# Patient Record
Sex: Female | Born: 1945 | Race: White | Hispanic: No | Marital: Married | Smoking: Never smoker
Health system: Southern US, Community
[De-identification: ages and names within clinical notes are randomized; demographics above are authoritative.]

## PROBLEM LIST (undated history)

## (undated) DIAGNOSIS — I10 Essential (primary) hypertension: Secondary | ICD-10-CM

## (undated) DIAGNOSIS — E7800 Pure hypercholesterolemia, unspecified: Secondary | ICD-10-CM

## (undated) HISTORY — PX: MELANOMA EXCISION: SHX5266

---

## 2001-09-05 ENCOUNTER — Other Ambulatory Visit: Admission: RE | Admit: 2001-09-05 | Discharge: 2001-09-05 | Payer: Self-pay | Admitting: Obstetrics and Gynecology

## 2002-10-14 ENCOUNTER — Ambulatory Visit (HOSPITAL_COMMUNITY): Admission: RE | Admit: 2002-10-14 | Discharge: 2002-10-14 | Payer: Self-pay | Admitting: Family Medicine

## 2002-10-14 ENCOUNTER — Encounter: Payer: Self-pay | Admitting: Family Medicine

## 2003-12-15 ENCOUNTER — Emergency Department (HOSPITAL_COMMUNITY): Admission: EM | Admit: 2003-12-15 | Discharge: 2003-12-15 | Payer: Self-pay | Admitting: Emergency Medicine

## 2004-03-23 ENCOUNTER — Ambulatory Visit (HOSPITAL_COMMUNITY): Admission: RE | Admit: 2004-03-23 | Discharge: 2004-03-23 | Payer: Self-pay | Admitting: Family Medicine

## 2007-03-20 ENCOUNTER — Ambulatory Visit (HOSPITAL_COMMUNITY): Admission: RE | Admit: 2007-03-20 | Discharge: 2007-03-20 | Payer: Self-pay | Admitting: Family Medicine

## 2007-06-24 ENCOUNTER — Ambulatory Visit (HOSPITAL_COMMUNITY): Admission: RE | Admit: 2007-06-24 | Discharge: 2007-06-24 | Payer: Self-pay | Admitting: Internal Medicine

## 2010-01-17 ENCOUNTER — Ambulatory Visit (HOSPITAL_COMMUNITY): Admission: RE | Admit: 2010-01-17 | Discharge: 2010-01-17 | Payer: Self-pay | Admitting: Obstetrics and Gynecology

## 2010-12-09 ENCOUNTER — Encounter: Payer: Self-pay | Admitting: Family Medicine

## 2010-12-10 ENCOUNTER — Encounter: Payer: Self-pay | Admitting: Family Medicine

## 2012-09-03 ENCOUNTER — Other Ambulatory Visit (HOSPITAL_COMMUNITY): Payer: Self-pay | Admitting: Obstetrics and Gynecology

## 2012-09-03 DIAGNOSIS — Z139 Encounter for screening, unspecified: Secondary | ICD-10-CM

## 2012-09-26 ENCOUNTER — Ambulatory Visit (HOSPITAL_COMMUNITY)
Admission: RE | Admit: 2012-09-26 | Discharge: 2012-09-26 | Disposition: A | Discharge status: Home / Self Care | Payer: BC Managed Care – PPO | Source: Ambulatory Visit | Attending: Obstetrics and Gynecology | Admitting: Obstetrics and Gynecology

## 2012-09-26 DIAGNOSIS — Z1231 Encounter for screening mammogram for malignant neoplasm of breast: Secondary | ICD-10-CM | POA: Insufficient documentation

## 2012-09-26 DIAGNOSIS — Z139 Encounter for screening, unspecified: Secondary | ICD-10-CM

## 2015-01-21 ENCOUNTER — Emergency Department (HOSPITAL_COMMUNITY): Payer: PPO

## 2015-01-21 ENCOUNTER — Observation Stay (HOSPITAL_COMMUNITY)
Admission: EM | Admit: 2015-01-21 | Discharge: 2015-01-22 | Disposition: A | Discharge status: Home / Self Care | Payer: PPO | Attending: Internal Medicine | Admitting: Internal Medicine

## 2015-01-21 ENCOUNTER — Encounter (HOSPITAL_COMMUNITY): Payer: Self-pay | Admitting: Emergency Medicine

## 2015-01-21 DIAGNOSIS — Z79899 Other long term (current) drug therapy: Secondary | ICD-10-CM | POA: Diagnosis not present

## 2015-01-21 DIAGNOSIS — E7800 Pure hypercholesterolemia, unspecified: Secondary | ICD-10-CM | POA: Diagnosis present

## 2015-01-21 DIAGNOSIS — I10 Essential (primary) hypertension: Secondary | ICD-10-CM

## 2015-01-21 DIAGNOSIS — R079 Chest pain, unspecified: Principal | ICD-10-CM

## 2015-01-21 HISTORY — DX: Essential (primary) hypertension: I10

## 2015-01-21 HISTORY — DX: Pure hypercholesterolemia, unspecified: E78.00

## 2015-01-21 LAB — BASIC METABOLIC PANEL
Anion gap: 8 (ref 5–15)
BUN: 11 mg/dL (ref 6–23)
CHLORIDE: 102 mmol/L (ref 96–112)
CO2: 29 mmol/L (ref 19–32)
CREATININE: 0.82 mg/dL (ref 0.50–1.10)
Calcium: 9.6 mg/dL (ref 8.4–10.5)
GFR calc non Af Amer: 73 mL/min — ABNORMAL LOW (ref >90)
GFR, EST AFRICAN AMERICAN: 85 mL/min — AB (ref >90)
GLUCOSE: 117 mg/dL — AB (ref 70–99)
POTASSIUM: 3.2 mmol/L — AB (ref 3.5–5.1)
Sodium: 139 mmol/L (ref 135–145)

## 2015-01-21 LAB — CBC
HEMATOCRIT: 39.2 % (ref 36.0–46.0)
HEMOGLOBIN: 13 g/dL (ref 12.0–15.0)
MCH: 30.6 pg (ref 26.0–34.0)
MCHC: 33.2 g/dL (ref 30.0–36.0)
MCV: 92.2 fL (ref 78.0–100.0)
Platelets: 257 10*3/uL (ref 150–400)
RBC: 4.25 MIL/uL (ref 3.87–5.11)
RDW: 13.2 % (ref 11.5–15.5)
WBC: 7.5 10*3/uL (ref 4.0–10.5)

## 2015-01-21 LAB — TROPONIN I

## 2015-01-21 MED ORDER — ASPIRIN 325 MG PO TABS
325.0000 mg | ORAL_TABLET | ORAL | Status: AC
  Administered 2015-01-21: 325 mg via ORAL
  Filled 2015-01-21: qty 1

## 2015-01-21 MED ORDER — ONDANSETRON HCL 4 MG/2ML IJ SOLN: 4.0000 mg | Freq: Four times a day (QID) | INTRAMUSCULAR | Status: DC | PRN

## 2015-01-21 MED ORDER — ACETAMINOPHEN 325 MG PO TABS: 650.0000 mg | ORAL_TABLET | ORAL | Status: DC | PRN

## 2015-01-21 MED ORDER — ASPIRIN EC 325 MG PO TBEC: 325.0000 mg | DELAYED_RELEASE_TABLET | Freq: Every day | ORAL | Status: DC

## 2015-01-21 MED ORDER — POTASSIUM CHLORIDE CRYS ER 20 MEQ PO TBCR
40.0000 meq | EXTENDED_RELEASE_TABLET | Freq: Once | ORAL | Status: AC
  Administered 2015-01-21: 40 meq via ORAL
  Filled 2015-01-21: qty 2

## 2015-01-21 MED ORDER — GI COCKTAIL ~~LOC~~: 30.0000 mL | Freq: Four times a day (QID) | ORAL | Status: DC | PRN

## 2015-01-21 NOTE — ED Notes (Addendum)
Pt arrives to registration area c/o of "chest burning" and "face pain". Denies all associated symptoms. NAD. Patient walking/speaking. Patient drove here from [place] to be seen.

## 2015-01-21 NOTE — ED Notes (Signed)
[facility], all questions answered.

## 2015-01-21 NOTE — ED Notes (Signed)
Call report attempt 2003 and again at 2023

## 2015-01-21 NOTE — ED Provider Notes (Signed)
CSN: [id number]     Arrival date & time 01/21/15  1730 History   First MD Initiated Contact with Patient 01/21/15 1754     Chief Complaint  Patient presents with  . Chest Pain      HPI Pt was seen at 1810. Per pt, c/o gradual onset and resolution of one episode of chest "pain" that occurred approximately 1630 PTA. Pt states she was driving her car when she felt a "heavy" chest discomfort which radiated a "tingling sensation" into her left neck and jaw. Pt states "it was not my indigestion." Pt states her symptoms lasted until arrival to the ED. Denies symptoms currently. Denies hx of same. Denies SOB/cough, no abd pain, no back pain, no N/V/D, no focal motor weakness, no tingling/numbness in extremities, no slurred speech, no facial droop.    Past Medical History  Diagnosis Date  . Hypertension   . High cholesterol    History reviewed. No pertinent past surgical history. Family History  Problem Relation Age of Onset  . Heart attack Mother     in her 25's  . Heart attack Father     in his 38's  . Heart attack Brother     multiple brother's with MI's in their 40's-50's   History  Substance Use Topics  . Smoking status: Never Smoker   . Smokeless tobacco: Not on file  . Alcohol Use: No    Review of Systems ROS: Statement: All systems negative except as marked or noted in the HPI; Constitutional: Negative for fever and chills. ; ; Eyes: Negative for eye pain, redness and discharge. ; ; ENMT: Negative for ear pain, hoarseness, nasal congestion, sinus pressure and sore throat. ; ; Cardiovascular: +CP. Negative for palpitations, diaphoresis, dyspnea and peripheral edema. ; ; Respiratory: Negative for cough, wheezing and stridor. ; ; Gastrointestinal: Negative for nausea, vomiting, diarrhea, abdominal pain, blood in stool, hematemesis, jaundice and rectal bleeding. . ; ; Genitourinary: Negative for dysuria, flank pain and hematuria. ; ; Musculoskeletal: Negative for back pain and neck  pain. Negative for swelling and trauma.; ; Skin: Negative for pruritus, rash, abrasions, blisters, bruising and skin lesion.; ; Neuro: +paresthesias. Negative for headache, lightheadedness and neck stiffness. Negative for weakness, altered level of consciousness , altered mental status, extremity weakness, involuntary movement, seizure and syncope.      Allergies  Review of patient's allergies indicates no known allergies.  Home Medications   Prior to Admission medications   Medication Sig Start Date End Date Taking? Authorizing Provider  hydrochlorothiazide (MICROZIDE) 12.5 MG capsule  12/27/14   Historical Provider, MD  simvastatin (ZOCOR) 10 MG tablet  12/27/14   Historical Provider, MD   BP 144/78 mmHg  Pulse 78  Resp 16  SpO2 100% Physical Exam 1815: Physical examination:  Nursing notes reviewed; Vital signs and O2 SAT reviewed;  Constitutional: Well developed, Well nourished, Well hydrated, In no acute distress; Head:  Normocephalic, atraumatic; Eyes: EOMI, PERRL, No scleral icterus; ENMT: Mouth and pharynx normal, Mucous membranes moist; Neck: Supple, Full range of motion, No lymphadenopathy; Cardiovascular: Regular rate and rhythm, No murmur, rub, or gallop; Respiratory: Breath sounds clear & equal bilaterally, No rales, rhonchi, wheezes.  Speaking full sentences with ease, Normal respiratory effort/excursion; Chest: Nontender, Movement normal; Abdomen: Soft, Nontender, Nondistended, Normal bowel sounds; Genitourinary: No CVA tenderness; Extremities: Pulses normal, No tenderness, No edema, No calf edema or asymmetry.; Neuro: AA&Ox3, Major CN grossly intact. No facial droop. Speech clear. No gross focal motor or sensory deficits  in extremities.; Skin: Color normal, Warm, Dry.   ED Course  Procedures     EKG Interpretation   Date/Time:  Friday January 21 2015 17:52:08 EST Ventricular Rate:  92 PR Interval:  161 QRS Duration: 89 QT Interval:  366 QTC Calculation: 453 R Axis:    78 Text Interpretation:  Sinus rhythm No old tracing to compare Confirmed by  [facility]  MD, Nicholos JohnsKATHLEEN [phone]([id number]) on 01/21/2015 5:59:25 PM      MDM  MDM Reviewed: previous chart, nursing note and vitals Reviewed previous: labs Interpretation: labs, ECG and x-ray      Results for orders placed or performed during the hospital encounter of 01/21/15  CBC  Result Value Ref Range   WBC 7.5 4.0 - 10.5 K/uL   RBC 4.25 3.87 - 5.11 MIL/uL   Hemoglobin 13.0 12.0 - 15.0 g/dL   HCT 60.439.2 54.036.0 - 98.146.0 %   MCV 92.2 78.0 - 100.0 fL   MCH 30.6 26.0 - 34.0 pg   MCHC 33.2 30.0 - 36.0 g/dL   RDW 19.113.2 47.811.5 - 29.515.5 %   Platelets 257 150 - 400 K/uL  Basic metabolic panel  Result Value Ref Range   Sodium 139 135 - 145 mmol/L   Potassium 3.2 (L) 3.5 - 5.1 mmol/L   Chloride 102 96 - 112 mmol/L   CO2 29 19 - 32 mmol/L   Glucose, Bld 117 (H) 70 - 99 mg/dL   BUN 11 6 - 23 mg/dL   Creatinine, Ser 6.210.82 0.50 - 1.10 mg/dL   Calcium 9.6 8.4 - 30.810.5 mg/dL   GFR calc non Af Amer 73 (L) >90 mL/min   GFR calc Af Amer 85 (L) >90 mL/min   Anion gap 8 5 - 15  Troponin I (MHP)  Result Value Ref Range   Troponin I <0.03 <0.031 ng/mL   Dg Chest Port 1 View 01/21/2015   CLINICAL DATA:  Chest pain and tingling.  EXAM: PORTABLE CHEST - 1 VIEW  COMPARISON:  None.  FINDINGS: Numerous leads and wires project over the chest. Midline trachea. Normal heart size and mediastinal contours. No pleural effusion or pneumothorax. Clear lungs.  IMPRESSION: No acute cardiopulmonary disease.   Electronically Signed   By: Jeronimo GreavesKyle  Talbot M.D.   On: 01/21/2015 18:03    1935:  Potassium repleted PO. Denies any further CP while in the ED. Heart score 4/TIMI 3; will observation admit. Dx and testing d/w pt and family.  Questions answered.  Verb understanding, agreeable to admit. T/C to [facility]. Onalee Huaavid, case discussed, including:  HPI, pertinent PM/SHx, VS/PE, dx testing, ED course and treatment:  Agreeable to admit, requests to write temporary  orders, obtain observation tele bed to team [facility].   Samuel JesterKathleen Brenlyn Beshara, DO 01/23/15 2122

## 2015-01-21 NOTE — H&P (Signed)
PCP:   Colette RibasGOLDING, [name], MD   Chief Complaint:  cp  HPI: 69 yo female h/o htn, hld comes in with acute onset of sscp that radiated to her left jaw and left shoulder while driving that lasted less than a minute.  No n/v/sob with it.  No swelling in legs.  Has 2 brothers with CAD in their 5850s.  Pt became cp free prior to arrival to ED.  Asked to obs for cardiac evaluation.  Has had stress testing before normal but over 5 years ago.  Has h/o gerd, but this was different.  No cough.  No fevers.  Review of Systems:  Positive and negative as per HPI otherwise all other systems are negative  Past Medical History: Past Medical History  Diagnosis Date  . Hypertension   . High cholesterol    History reviewed. No pertinent past surgical history.  Medications: Prior to Admission medications   Medication Sig Start Date End Date Taking? Authorizing Provider  aspirin EC 81 MG tablet Take 81 mg by mouth every evening.   Yes Historical Provider, MD  calcium carbonate (OS-CAL) 600 MG TABS tablet Take 1,200 mg by mouth every evening.   Yes Historical Provider, MD  cetirizine (ZYRTEC) 10 MG tablet Take 10 mg by mouth every morning.   Yes Historical Provider, MD  Fish Oil-Cholecalciferol (FISH OIL + D3 PO) Take 1 capsule by mouth every evening.   Yes Historical Provider, MD  hydrochlorothiazide (MICROZIDE) 12.5 MG capsule Take by mouth daily.  12/27/14  Yes Historical Provider, MD  simvastatin (ZOCOR) 10 MG tablet Take 10 mg by mouth daily.  12/27/14  Yes Historical Provider, MD    Allergies:  No Known Allergies  Social History:  reports that she has never smoked. She does not have any smokeless tobacco history on file. She reports that she does not drink alcohol or use illicit drugs.  Family History: Family History  Problem Relation Age of Onset  . Heart attack Mother     in her 8870's  . Heart attack Father     in his 4570's  . Heart attack Brother     multiple brother's with MI's in their  40's-50's    Physical Exam: Filed Vitals:   01/21/15 1814 01/21/15 1830 01/21/15 1900 01/21/15 1930  BP: 146/83 144/78 143/73 145/77  Pulse: 86 78 80 73  Resp: 12 16 13 13   SpO2: 100% 100% 100% 100%   General appearance: alert, cooperative and no distress Head: Normocephalic, without obvious abnormality, atraumatic Eyes: negative Nose: Nares normal. Septum midline. Mucosa normal. No drainage or sinus tenderness. Neck: no JVD and supple, symmetrical, trachea midline Lungs: clear to auscultation bilaterally Heart: regular rate and rhythm, S1, S2 normal, no murmur, click, rub or gallop Abdomen: soft, non-tender; bowel sounds normal; no masses,  no organomegaly Extremities: extremities normal, atraumatic, no cyanosis or edema Pulses: 2+ and symmetric Skin: Skin color, texture, turgor normal. No rashes or lesions Neurologic: Grossly normal   Labs on Admission:   Recent Labs  01/21/15 1757  NA 139  K 3.2*  CL 102  CO2 29  GLUCOSE 117*  BUN 11  CREATININE 0.82  CALCIUM 9.6    Recent Labs  01/21/15 1757  WBC 7.5  HGB 13.0  HCT 39.2  MCV 92.2  PLT 257    Recent Labs  01/21/15 1757  TROPONINI <0.03   Radiological Exams on Admission: Dg Chest Port 1 View  01/21/2015   CLINICAL DATA:  Chest pain  and tingling.  EXAM: PORTABLE CHEST - 1 VIEW  COMPARISON:  None.  FINDINGS: Numerous leads and wires project over the chest. Midline trachea. Normal heart size and mediastinal contours. No pleural effusion or pneumothorax. Clear lungs.  IMPRESSION: No acute cardiopulmonary disease.   Electronically Signed   By: Jeronimo Greaves M.D.   On: 01/21/2015 18:03    Assessment/Plan  69 yo female with chest pain  Principal Problem:   Chest pain-  Lasting less than one minute.  Romi.  Serial enzymes.  Echo in am.  outpt stress testing.    Active Problems:   Hypertension-  stable   High cholesterol  Stable Strong family history of early CAD- noted  obs on tele.  Full  code.  Karen Huber A 01/21/2015, 8:55 PM

## 2015-01-21 NOTE — ED Notes (Signed)
Patient complaining of sudden onset of chest burning with tingling sensation radiating into left neck and jaw starting approximately 1645 today.

## 2015-01-22 DIAGNOSIS — E780 Pure hypercholesterolemia: Secondary | ICD-10-CM | POA: Diagnosis not present

## 2015-01-22 DIAGNOSIS — R072 Precordial pain: Secondary | ICD-10-CM

## 2015-01-22 DIAGNOSIS — I10 Essential (primary) hypertension: Secondary | ICD-10-CM | POA: Diagnosis not present

## 2015-01-22 DIAGNOSIS — R079 Chest pain, unspecified: Secondary | ICD-10-CM | POA: Diagnosis not present

## 2015-01-22 LAB — TROPONIN I

## 2015-01-22 NOTE — Discharge Summary (Signed)
Physician Discharge Summary  Karen Huber WUJ:[id number] DOB: 1946/08/09 DOA: 01/21/2015  PCP: Colette Ribas, MD  Admit date: 01/21/2015 Discharge date: 01/22/2015  Recommendations for Outpatient Follow-up:  1. Check CBC and BMP during next appt with PCP  Discharge Diagnoses:  Principal Problem:   Chest pain Active Problems:   Hypertension   High cholesterol   Pain in the chest   Essential hypertension    Discharge Condition: stable   Diet recommendation: as tolerated   History of present illness:  69 yo female h/o hypertension, dyslipidemia who presented to [facility] ED with sudden onset substernal chest pain, 5-6/10 in intensity, radiating to jaw and left shoulder, started at rest and not relieved with aspirin. Pain is constant and lasted for 1 minute. Not worse with exertion. No associated shortness of breath or palpitation. Her 12 lead EKG showed sinus rhythm. The trop level was neg on admission.  Hospital Course:   Principal Problem:   Chest pain - resolved completely - 12 lead EKG showed sinus rhythm - trop x 3 negative - continue daily aspirin   Active Problems:    Essential hypertension - resume Microzide    High cholesterol - resume zocor    Signed:  Manson Passey, MD  [facility] 01/22/2015, 7:37 AM  Pager #: [phone]  Procedures:  None   Consultations:  None   Discharge Exam: Filed Vitals:   01/22/15 0703  BP: 130/67  Pulse: 76  Temp: 98 F (36.7 C)  Resp: 20   Filed Vitals:   01/21/15 1900 01/21/15 1930 01/21/15 2239 01/22/15 0703  BP: 143/73 145/77 134/62 130/67  Pulse: 80 73 77 76  Temp:   98.3 F (36.8 C) 98 F (36.7 C)  TempSrc:   Oral Oral  Resp: Weight:   55.157 kg (121 lb 9.6 oz)   SpO2: 100% 100% 100% 100%    General: Pt is alert, follows commands appropriately, not in acute distress Cardiovascular: Regular rate and rhythm, S1/S2 +, no murmurs Respiratory: Clear to auscultation bilaterally, no  wheezing, no crackles, no rhonchi Abdominal: Soft, non tender, non distended, bowel sounds +, no guarding Extremities: no edema, no cyanosis, pulses palpable bilaterally DP and PT Neuro: Grossly nonfocal  Discharge Instructions  Discharge Instructions    Call MD for:  difficulty breathing, headache or visual disturbances    Complete by:  As directed      Call MD for:  persistant nausea and vomiting    Complete by:  As directed      Call MD for:  severe uncontrolled pain    Complete by:  As directed      Diet - low sodium heart healthy    Complete by:  As directed      Increase activity slowly    Complete by:  As directed             Medication List    TAKE these medications        aspirin EC 81 MG tablet  Take 81 mg by mouth every evening.     calcium carbonate 600 MG Tabs tablet  Commonly known as:  OS-CAL  Take 1,200 mg by mouth every evening.     cetirizine 10 MG tablet  Commonly known as:  ZYRTEC  Take 10 mg by mouth every morning.     FISH OIL + D3 PO  Take 1 capsule by mouth every evening.     hydrochlorothiazide 12.5 MG capsule  Commonly known as:  MICROZIDE  Take by mouth daily.     simvastatin 10 MG tablet  Commonly known as:  ZOCOR  Take 10 mg by mouth daily.           Follow-up Information    Follow up with Colette RibasGOLDING, [name], MD. Schedule an appointment as soon as possible for a visit in 2 weeks.   Specialty:  Family Medicine   Why:  Follow up appt after recent hospitalization   Contact information:   [address] [place] [address] [phone]        The results of significant diagnostics from this hospitalization (including imaging, microbiology, ancillary and laboratory) are listed below for reference.    Significant Diagnostic Studies: Dg Chest Port 1 View  01/21/2015   CLINICAL DATA:  Chest pain and tingling.  EXAM: PORTABLE CHEST - 1 VIEW  COMPARISON:  None.  FINDINGS: Numerous leads and wires project over the chest.  Midline trachea. Normal heart size and mediastinal contours. No pleural effusion or pneumothorax. Clear lungs.  IMPRESSION: No acute cardiopulmonary disease.   Electronically Signed   By: Jeronimo GreavesKyle  Talbot M.D.   On: 01/21/2015 18:03    Microbiology: No results found for this or any previous visit (from the past 240 hour(s)).   Labs: Basic Metabolic Panel:  Recent Labs Lab 01/21/15 1757  NA 139  K 3.2*  CL 102  CO2 29  GLUCOSE 117*  BUN 11  CREATININE 0.82  CALCIUM 9.6   Liver Function Tests: No results for input(s): AST, ALT, ALKPHOS, BILITOT, PROT, ALBUMIN in the last 168 hours. No results for input(s): LIPASE, AMYLASE in the last 168 hours. No results for input(s): AMMONIA in the last 168 hours. CBC:  Recent Labs Lab 01/21/15 1757  WBC 7.5  HGB 13.0  HCT 39.2  MCV 92.2  PLT 257   Cardiac Enzymes:  Recent Labs Lab 01/21/15 1757 01/21/15 2231 01/22/15 0439  TROPONINI <0.03 <0.03 <0.03   BNP: BNP (last 3 results) No results for input(s): BNP in the last 8760 hours.  ProBNP (last 3 results) No results for input(s): PROBNP in the last 8760 hours.  CBG: No results for input(s): GLUCAP in the last 168 hours.  Time coordinating discharge: Over 30 minutes

## 2015-01-22 NOTE — Progress Notes (Signed)
Patient discharged home with husband this morning.  Patient was given discharge instructions and verbalized understanding with no complaints or concerns voiced at this time.  IV was removed with catheter intact, no bleeding or complications.  Patient left unit in stable condition with a staff member.

## 2015-01-22 NOTE — Discharge Instructions (Signed)

## 2015-12-26 ENCOUNTER — Other Ambulatory Visit (HOSPITAL_COMMUNITY): Payer: Self-pay | Admitting: Obstetrics and Gynecology

## 2015-12-26 DIAGNOSIS — Z1231 Encounter for screening mammogram for malignant neoplasm of breast: Secondary | ICD-10-CM

## 2016-01-06 ENCOUNTER — Ambulatory Visit (HOSPITAL_COMMUNITY)
Admission: RE | Admit: 2016-01-06 | Discharge: 2016-01-06 | Disposition: A | Discharge status: Home / Self Care | Payer: PPO | Source: Ambulatory Visit | Attending: Obstetrics and Gynecology | Admitting: Obstetrics and Gynecology

## 2016-01-06 DIAGNOSIS — Z1389 Encounter for screening for other disorder: Secondary | ICD-10-CM | POA: Diagnosis not present

## 2016-01-06 DIAGNOSIS — Z1231 Encounter for screening mammogram for malignant neoplasm of breast: Secondary | ICD-10-CM | POA: Diagnosis not present

## 2016-01-06 DIAGNOSIS — Z6823 Body mass index (BMI) 23.0-23.9, adult: Secondary | ICD-10-CM | POA: Diagnosis not present

## 2016-01-06 DIAGNOSIS — I10 Essential (primary) hypertension: Secondary | ICD-10-CM | POA: Diagnosis not present

## 2016-01-06 DIAGNOSIS — E782 Mixed hyperlipidemia: Secondary | ICD-10-CM | POA: Diagnosis not present

## 2016-01-12 DIAGNOSIS — Z01419 Encounter for gynecological examination (general) (routine) without abnormal findings: Secondary | ICD-10-CM | POA: Diagnosis not present

## 2016-01-13 DIAGNOSIS — Z6823 Body mass index (BMI) 23.0-23.9, adult: Secondary | ICD-10-CM | POA: Diagnosis not present

## 2016-01-13 DIAGNOSIS — I10 Essential (primary) hypertension: Secondary | ICD-10-CM | POA: Diagnosis not present

## 2016-01-13 DIAGNOSIS — Z1389 Encounter for screening for other disorder: Secondary | ICD-10-CM | POA: Diagnosis not present

## 2016-02-16 DIAGNOSIS — Z1389 Encounter for screening for other disorder: Secondary | ICD-10-CM | POA: Diagnosis not present

## 2016-02-16 DIAGNOSIS — Z0000 Encounter for general adult medical examination without abnormal findings: Secondary | ICD-10-CM | POA: Diagnosis not present

## 2016-02-16 DIAGNOSIS — Z6822 Body mass index (BMI) 22.0-22.9, adult: Secondary | ICD-10-CM | POA: Diagnosis not present

## 2016-02-24 ENCOUNTER — Other Ambulatory Visit (HOSPITAL_COMMUNITY): Payer: Self-pay | Admitting: Family Medicine

## 2016-02-28 ENCOUNTER — Other Ambulatory Visit (HOSPITAL_COMMUNITY): Payer: Self-pay | Admitting: Family Medicine

## 2016-02-28 DIAGNOSIS — M8580 Other specified disorders of bone density and structure, unspecified site: Secondary | ICD-10-CM

## 2016-03-02 ENCOUNTER — Other Ambulatory Visit (HOSPITAL_COMMUNITY): Payer: PPO

## 2016-03-30 ENCOUNTER — Ambulatory Visit (HOSPITAL_COMMUNITY)
Admission: RE | Admit: 2016-03-30 | Discharge: 2016-03-30 | Disposition: A | Discharge status: Home / Self Care | Payer: PPO | Source: Ambulatory Visit | Attending: Family Medicine | Admitting: Family Medicine

## 2016-03-30 DIAGNOSIS — M899 Disorder of bone, unspecified: Secondary | ICD-10-CM | POA: Diagnosis not present

## 2016-03-30 DIAGNOSIS — M8580 Other specified disorders of bone density and structure, unspecified site: Secondary | ICD-10-CM

## 2016-03-30 DIAGNOSIS — M810 Age-related osteoporosis without current pathological fracture: Secondary | ICD-10-CM | POA: Insufficient documentation

## 2016-04-12 DIAGNOSIS — H2513 Age-related nuclear cataract, bilateral: Secondary | ICD-10-CM | POA: Diagnosis not present

## 2017-02-08 DIAGNOSIS — Z6823 Body mass index (BMI) 23.0-23.9, adult: Secondary | ICD-10-CM | POA: Diagnosis not present

## 2017-02-08 DIAGNOSIS — E782 Mixed hyperlipidemia: Secondary | ICD-10-CM | POA: Diagnosis not present

## 2017-02-21 DIAGNOSIS — E782 Mixed hyperlipidemia: Secondary | ICD-10-CM | POA: Diagnosis not present

## 2017-02-21 DIAGNOSIS — Z6823 Body mass index (BMI) 23.0-23.9, adult: Secondary | ICD-10-CM | POA: Diagnosis not present

## 2017-02-21 DIAGNOSIS — Z1389 Encounter for screening for other disorder: Secondary | ICD-10-CM | POA: Diagnosis not present

## 2017-05-20 ENCOUNTER — Other Ambulatory Visit (HOSPITAL_COMMUNITY): Payer: Self-pay | Admitting: Family Medicine

## 2017-05-20 DIAGNOSIS — Z1231 Encounter for screening mammogram for malignant neoplasm of breast: Secondary | ICD-10-CM

## 2017-06-03 DIAGNOSIS — H2513 Age-related nuclear cataract, bilateral: Secondary | ICD-10-CM | POA: Diagnosis not present

## 2018-02-10 ENCOUNTER — Other Ambulatory Visit (HOSPITAL_COMMUNITY): Payer: Self-pay | Admitting: Obstetrics and Gynecology

## 2018-02-10 DIAGNOSIS — Z1231 Encounter for screening mammogram for malignant neoplasm of breast: Secondary | ICD-10-CM

## 2018-02-13 DIAGNOSIS — Z1389 Encounter for screening for other disorder: Secondary | ICD-10-CM | POA: Diagnosis not present

## 2018-02-13 DIAGNOSIS — I10 Essential (primary) hypertension: Secondary | ICD-10-CM | POA: Diagnosis not present

## 2018-02-13 DIAGNOSIS — E7849 Other hyperlipidemia: Secondary | ICD-10-CM | POA: Diagnosis not present

## 2018-02-13 DIAGNOSIS — Z6824 Body mass index (BMI) 24.0-24.9, adult: Secondary | ICD-10-CM | POA: Diagnosis not present

## 2018-02-21 ENCOUNTER — Ambulatory Visit (HOSPITAL_COMMUNITY): Payer: PPO

## 2018-02-24 ENCOUNTER — Encounter (HOSPITAL_COMMUNITY): Payer: Self-pay

## 2018-02-24 ENCOUNTER — Ambulatory Visit (HOSPITAL_COMMUNITY)
Admission: RE | Admit: 2018-02-24 | Discharge: 2018-02-24 | Disposition: A | Discharge status: Home / Self Care | Payer: PPO | Source: Ambulatory Visit | Attending: Obstetrics and Gynecology | Admitting: Obstetrics and Gynecology

## 2018-02-24 DIAGNOSIS — Z1231 Encounter for screening mammogram for malignant neoplasm of breast: Secondary | ICD-10-CM | POA: Diagnosis not present

## 2018-02-26 DIAGNOSIS — R7309 Other abnormal glucose: Secondary | ICD-10-CM | POA: Diagnosis not present

## 2018-02-26 DIAGNOSIS — R739 Hyperglycemia, unspecified: Secondary | ICD-10-CM | POA: Diagnosis not present

## 2018-02-26 DIAGNOSIS — E7849 Other hyperlipidemia: Secondary | ICD-10-CM | POA: Diagnosis not present

## 2018-02-26 DIAGNOSIS — Z1389 Encounter for screening for other disorder: Secondary | ICD-10-CM | POA: Diagnosis not present

## 2018-02-26 DIAGNOSIS — Z6824 Body mass index (BMI) 24.0-24.9, adult: Secondary | ICD-10-CM | POA: Diagnosis not present

## 2018-07-17 IMAGING — MG DIGITAL SCREENING BILATERAL MAMMOGRAM WITH CAD
4 series · 4 of 4 positions shown · non-contrast
Comparison: Previous exam(s).

CLINICAL DATA: Screening.

EXAM:
DIGITAL SCREENING BILATERAL MAMMOGRAM WITH CAD

[L MLO]
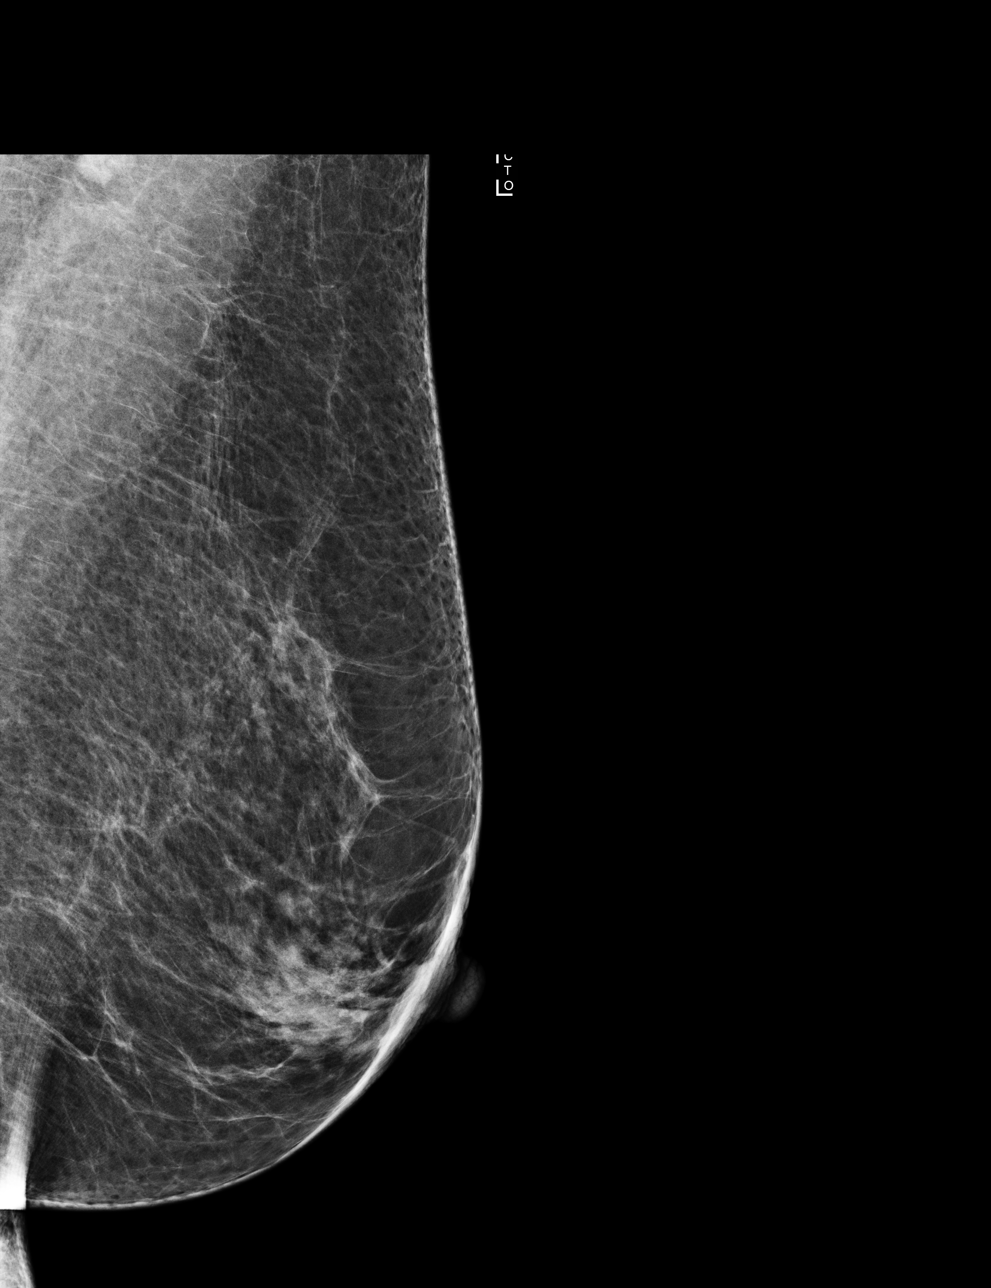

[R CC]
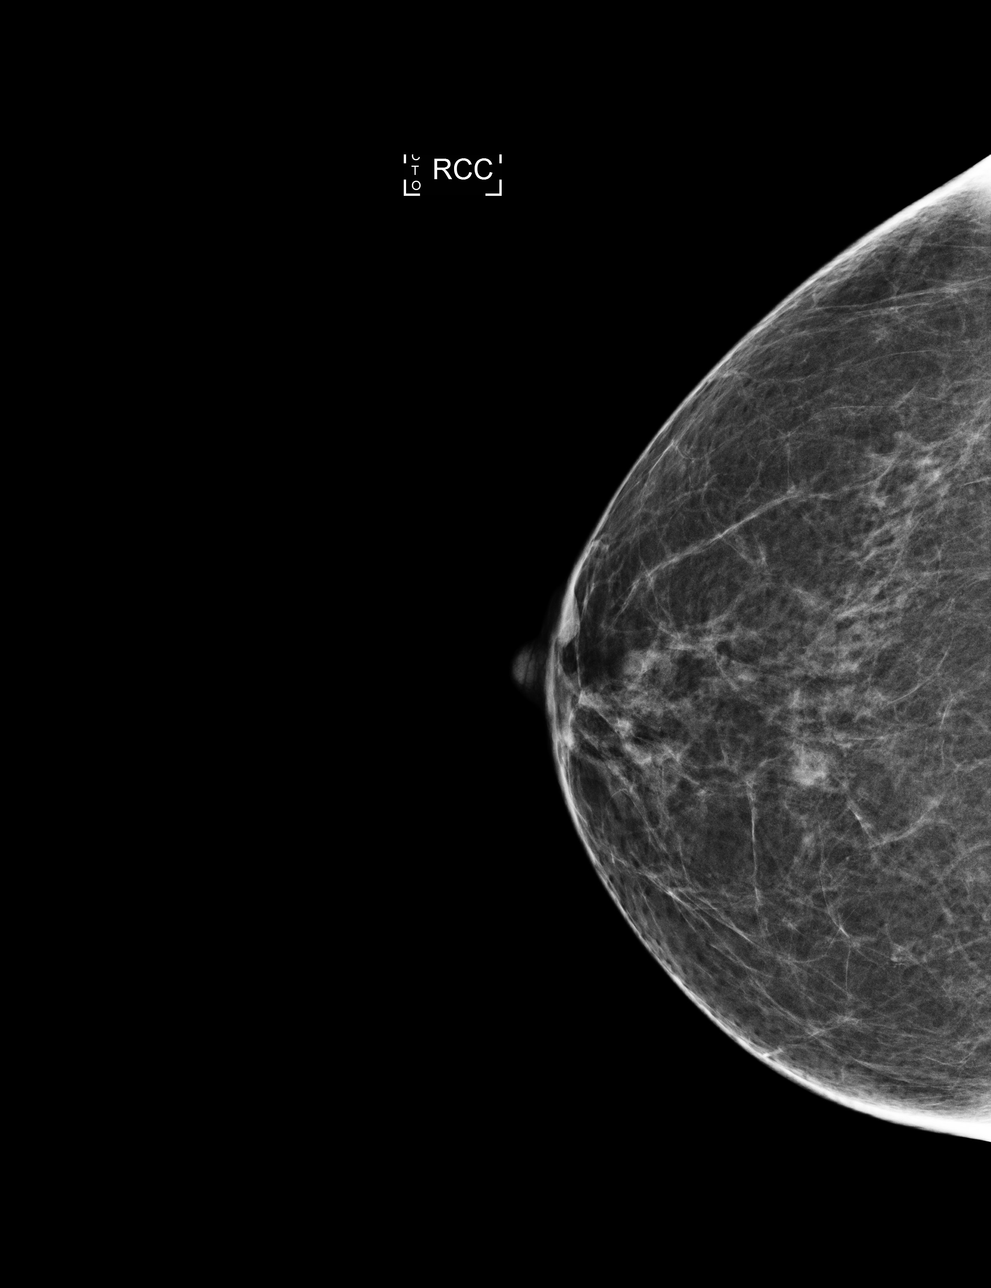

[L CC]
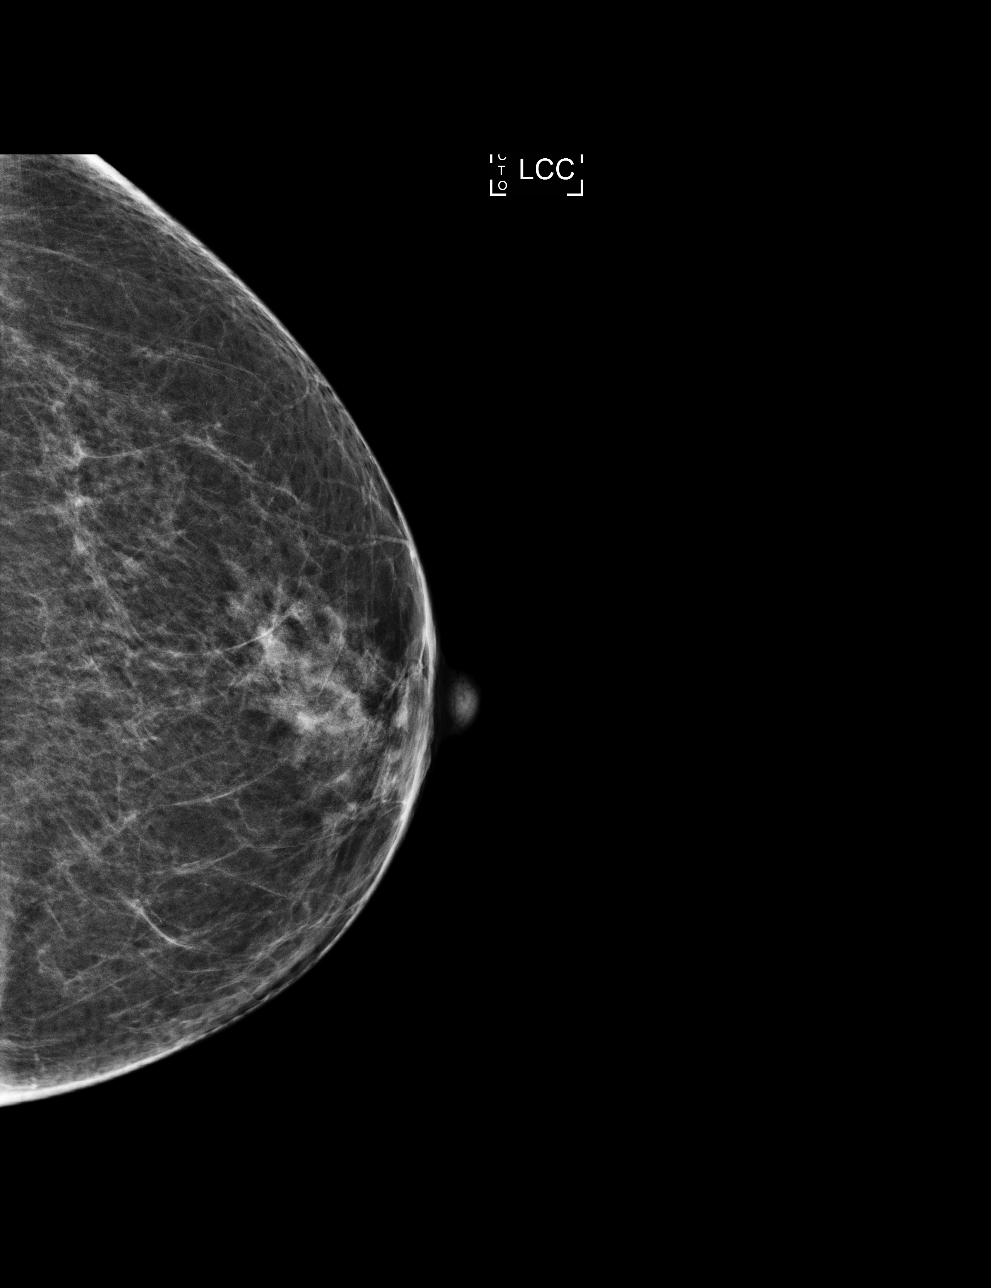

[R MLO]
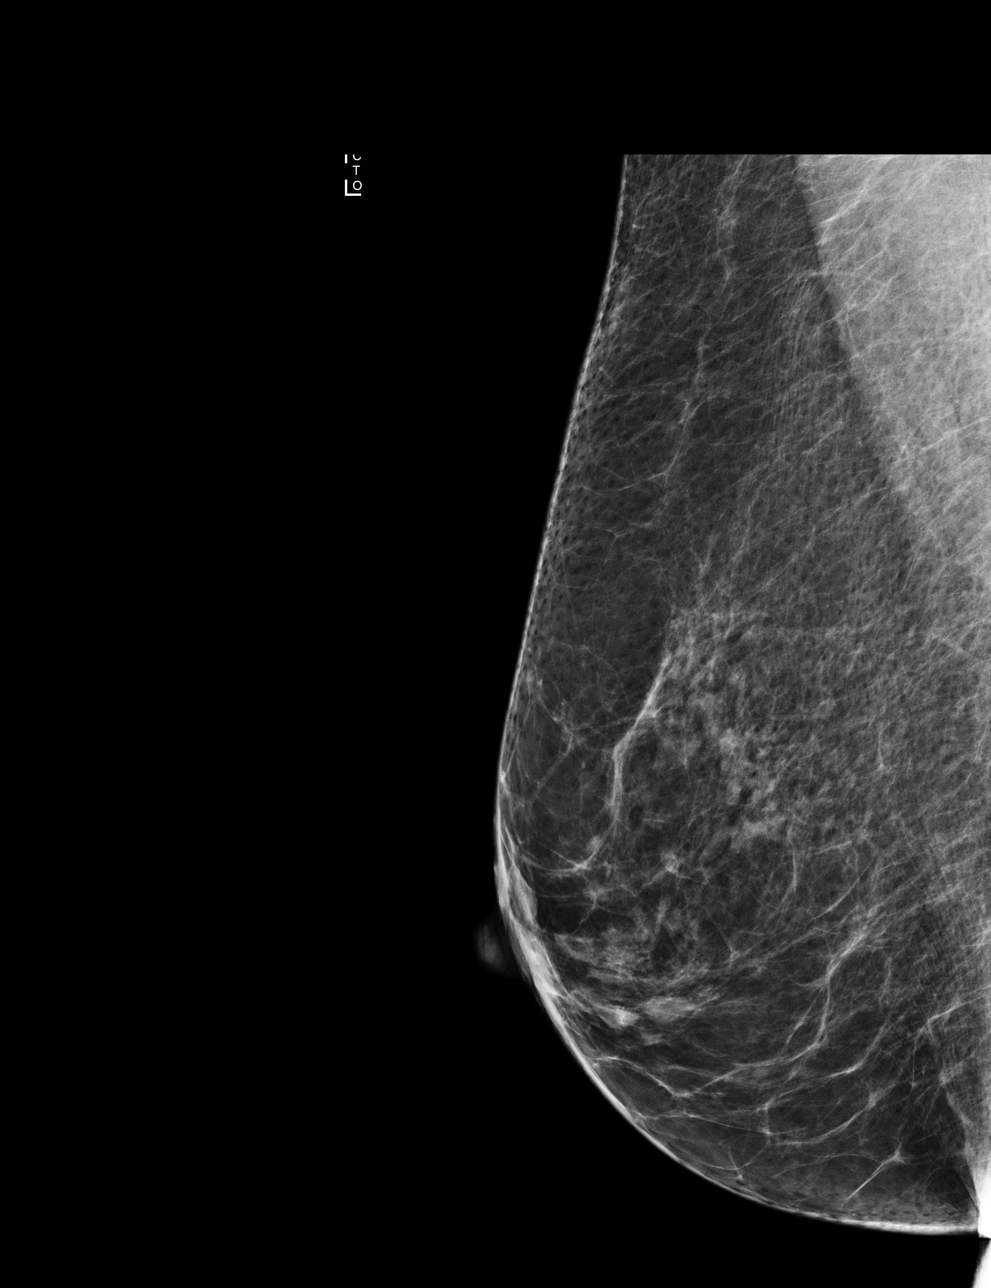

[4 of 4 positions shown; findings below may reference images not displayed]

ACR Breast Density Category c: The breast tissue is heterogeneously
dense, which may obscure small masses.
FINDINGS: There are no findings suspicious for malignancy. Images were
processed with CAD.
IMPRESSION: No mammographic evidence of malignancy. A result letter of this
screening mammogram will be mailed directly to the patient.

RECOMMENDATION:
Screening mammogram in one year. (Code:YJ-[id number])

BI-RADS CATEGORY  1: Negative.

## 2019-02-13 DIAGNOSIS — I10 Essential (primary) hypertension: Secondary | ICD-10-CM | POA: Diagnosis not present

## 2019-02-13 DIAGNOSIS — E7849 Other hyperlipidemia: Secondary | ICD-10-CM | POA: Diagnosis not present

## 2019-02-19 DIAGNOSIS — Z1389 Encounter for screening for other disorder: Secondary | ICD-10-CM | POA: Diagnosis not present

## 2019-02-19 DIAGNOSIS — I10 Essential (primary) hypertension: Secondary | ICD-10-CM | POA: Diagnosis not present

## 2019-02-19 DIAGNOSIS — L820 Inflamed seborrheic keratosis: Secondary | ICD-10-CM | POA: Diagnosis not present

## 2019-02-19 DIAGNOSIS — R109 Unspecified abdominal pain: Secondary | ICD-10-CM | POA: Diagnosis not present

## 2019-02-19 DIAGNOSIS — Z681 Body mass index (BMI) 19 or less, adult: Secondary | ICD-10-CM | POA: Diagnosis not present

## 2019-02-19 DIAGNOSIS — E7849 Other hyperlipidemia: Secondary | ICD-10-CM | POA: Diagnosis not present

## 2019-02-19 DIAGNOSIS — R05 Cough: Secondary | ICD-10-CM | POA: Diagnosis not present

## 2019-02-19 DIAGNOSIS — Z0001 Encounter for general adult medical examination with abnormal findings: Secondary | ICD-10-CM | POA: Diagnosis not present

## 2019-04-16 ENCOUNTER — Other Ambulatory Visit (HOSPITAL_COMMUNITY): Payer: Self-pay | Admitting: Obstetrics and Gynecology

## 2019-04-16 DIAGNOSIS — Z1231 Encounter for screening mammogram for malignant neoplasm of breast: Secondary | ICD-10-CM

## 2019-07-23 DIAGNOSIS — R7309 Other abnormal glucose: Secondary | ICD-10-CM | POA: Diagnosis not present

## 2019-07-23 DIAGNOSIS — Z1389 Encounter for screening for other disorder: Secondary | ICD-10-CM | POA: Diagnosis not present

## 2019-07-23 DIAGNOSIS — Z681 Body mass index (BMI) 19 or less, adult: Secondary | ICD-10-CM | POA: Diagnosis not present

## 2019-07-23 DIAGNOSIS — E7849 Other hyperlipidemia: Secondary | ICD-10-CM | POA: Diagnosis not present

## 2020-02-26 DIAGNOSIS — I10 Essential (primary) hypertension: Secondary | ICD-10-CM | POA: Diagnosis not present

## 2020-02-26 DIAGNOSIS — Z1389 Encounter for screening for other disorder: Secondary | ICD-10-CM | POA: Diagnosis not present

## 2020-02-26 DIAGNOSIS — E7849 Other hyperlipidemia: Secondary | ICD-10-CM | POA: Diagnosis not present

## 2020-02-26 DIAGNOSIS — Z6821 Body mass index (BMI) 21.0-21.9, adult: Secondary | ICD-10-CM | POA: Diagnosis not present

## 2020-02-26 DIAGNOSIS — R7309 Other abnormal glucose: Secondary | ICD-10-CM | POA: Diagnosis not present

## 2020-02-26 DIAGNOSIS — Z0000 Encounter for general adult medical examination without abnormal findings: Secondary | ICD-10-CM | POA: Diagnosis not present

## 2021-02-10 ENCOUNTER — Other Ambulatory Visit (HOSPITAL_COMMUNITY): Payer: Self-pay | Admitting: Family Medicine

## 2021-02-10 DIAGNOSIS — Z0000 Encounter for general adult medical examination without abnormal findings: Secondary | ICD-10-CM | POA: Diagnosis not present

## 2021-02-10 DIAGNOSIS — E7849 Other hyperlipidemia: Secondary | ICD-10-CM | POA: Diagnosis not present

## 2021-02-10 DIAGNOSIS — Z1331 Encounter for screening for depression: Secondary | ICD-10-CM | POA: Diagnosis not present

## 2021-02-10 DIAGNOSIS — Z1231 Encounter for screening mammogram for malignant neoplasm of breast: Secondary | ICD-10-CM

## 2021-02-10 DIAGNOSIS — Z6822 Body mass index (BMI) 22.0-22.9, adult: Secondary | ICD-10-CM | POA: Diagnosis not present

## 2021-02-10 DIAGNOSIS — I10 Essential (primary) hypertension: Secondary | ICD-10-CM | POA: Diagnosis not present

## 2021-02-10 DIAGNOSIS — Z8249 Family history of ischemic heart disease and other diseases of the circulatory system: Secondary | ICD-10-CM | POA: Diagnosis not present

## 2021-02-15 ENCOUNTER — Ambulatory Visit (HOSPITAL_COMMUNITY)
Admission: RE | Admit: 2021-02-15 | Discharge: 2021-02-15 | Disposition: A | Discharge status: Home / Self Care | Payer: PPO | Source: Ambulatory Visit | Attending: Family Medicine | Admitting: Family Medicine

## 2021-02-15 DIAGNOSIS — R739 Hyperglycemia, unspecified: Secondary | ICD-10-CM | POA: Diagnosis not present

## 2021-02-15 DIAGNOSIS — R7309 Other abnormal glucose: Secondary | ICD-10-CM | POA: Diagnosis not present

## 2021-02-15 DIAGNOSIS — Z0000 Encounter for general adult medical examination without abnormal findings: Secondary | ICD-10-CM | POA: Diagnosis not present

## 2021-02-15 DIAGNOSIS — Z1389 Encounter for screening for other disorder: Secondary | ICD-10-CM | POA: Diagnosis not present

## 2021-02-15 DIAGNOSIS — Z1231 Encounter for screening mammogram for malignant neoplasm of breast: Secondary | ICD-10-CM | POA: Diagnosis not present

## 2021-02-15 DIAGNOSIS — Z6822 Body mass index (BMI) 22.0-22.9, adult: Secondary | ICD-10-CM | POA: Diagnosis not present

## 2022-05-08 ENCOUNTER — Other Ambulatory Visit (HOSPITAL_COMMUNITY): Payer: Self-pay | Admitting: Family Medicine

## 2022-05-08 DIAGNOSIS — Z1231 Encounter for screening mammogram for malignant neoplasm of breast: Secondary | ICD-10-CM

## 2022-05-10 ENCOUNTER — Other Ambulatory Visit (HOSPITAL_COMMUNITY): Payer: Self-pay | Admitting: Family Medicine

## 2022-05-10 DIAGNOSIS — Z6823 Body mass index (BMI) 23.0-23.9, adult: Secondary | ICD-10-CM | POA: Diagnosis not present

## 2022-05-10 DIAGNOSIS — Z0001 Encounter for general adult medical examination with abnormal findings: Secondary | ICD-10-CM | POA: Diagnosis not present

## 2022-05-10 DIAGNOSIS — I10 Essential (primary) hypertension: Secondary | ICD-10-CM | POA: Diagnosis not present

## 2022-05-10 DIAGNOSIS — Z1331 Encounter for screening for depression: Secondary | ICD-10-CM | POA: Diagnosis not present

## 2022-05-10 DIAGNOSIS — Z8249 Family history of ischemic heart disease and other diseases of the circulatory system: Secondary | ICD-10-CM | POA: Diagnosis not present

## 2022-05-10 DIAGNOSIS — E782 Mixed hyperlipidemia: Secondary | ICD-10-CM | POA: Diagnosis not present

## 2022-05-11 DIAGNOSIS — Z0001 Encounter for general adult medical examination with abnormal findings: Secondary | ICD-10-CM | POA: Diagnosis not present

## 2022-05-18 ENCOUNTER — Ambulatory Visit (HOSPITAL_COMMUNITY)
Admission: RE | Admit: 2022-05-18 | Discharge: 2022-05-18 | Disposition: A | Discharge status: Home / Self Care | Payer: PPO | Source: Ambulatory Visit | Attending: Family Medicine | Admitting: Family Medicine

## 2022-05-18 DIAGNOSIS — Z1231 Encounter for screening mammogram for malignant neoplasm of breast: Secondary | ICD-10-CM | POA: Insufficient documentation

## 2022-06-05 ENCOUNTER — Other Ambulatory Visit (HOSPITAL_COMMUNITY): Payer: Self-pay | Admitting: Family Medicine

## 2022-06-05 DIAGNOSIS — Z0001 Encounter for general adult medical examination with abnormal findings: Secondary | ICD-10-CM

## 2022-07-02 DIAGNOSIS — H524 Presbyopia: Secondary | ICD-10-CM | POA: Diagnosis not present

## 2022-07-02 DIAGNOSIS — H2513 Age-related nuclear cataract, bilateral: Secondary | ICD-10-CM | POA: Diagnosis not present

## 2022-12-28 ENCOUNTER — Other Ambulatory Visit: Payer: Self-pay

## 2022-12-28 ENCOUNTER — Emergency Department (HOSPITAL_COMMUNITY): Payer: PPO

## 2022-12-28 ENCOUNTER — Emergency Department (HOSPITAL_COMMUNITY)
Admission: EM | Admit: 2022-12-28 | Discharge: 2022-12-28 | Disposition: A | Discharge status: Home / Self Care | Payer: PPO | Attending: Emergency Medicine | Admitting: Emergency Medicine

## 2022-12-28 ENCOUNTER — Encounter (HOSPITAL_COMMUNITY): Payer: Self-pay | Admitting: Emergency Medicine

## 2022-12-28 DIAGNOSIS — Z79899 Other long term (current) drug therapy: Secondary | ICD-10-CM | POA: Diagnosis not present

## 2022-12-28 DIAGNOSIS — R231 Pallor: Secondary | ICD-10-CM | POA: Diagnosis not present

## 2022-12-28 DIAGNOSIS — Z7982 Long term (current) use of aspirin: Secondary | ICD-10-CM | POA: Diagnosis not present

## 2022-12-28 DIAGNOSIS — Z8679 Personal history of other diseases of the circulatory system: Secondary | ICD-10-CM

## 2022-12-28 DIAGNOSIS — I4891 Unspecified atrial fibrillation: Secondary | ICD-10-CM | POA: Insufficient documentation

## 2022-12-28 DIAGNOSIS — R002 Palpitations: Secondary | ICD-10-CM

## 2022-12-28 DIAGNOSIS — I10 Essential (primary) hypertension: Secondary | ICD-10-CM | POA: Insufficient documentation

## 2022-12-28 DIAGNOSIS — R110 Nausea: Secondary | ICD-10-CM | POA: Diagnosis not present

## 2022-12-28 DIAGNOSIS — R000 Tachycardia, unspecified: Secondary | ICD-10-CM | POA: Diagnosis not present

## 2022-12-28 LAB — BASIC METABOLIC PANEL
Anion gap: 9 (ref 5–15)
BUN: 18 mg/dL (ref 8–23)
CO2: 28 mmol/L (ref 22–32)
Calcium: 9.1 mg/dL (ref 8.9–10.3)
Chloride: 98 mmol/L (ref 98–111)
Creatinine, Ser: 0.96 mg/dL (ref 0.44–1.00)
GFR, Estimated: >60 mL/min (ref >60)
Glucose, Bld: 120 mg/dL — ABNORMAL HIGH (ref 70–99)
Potassium: 3.5 mmol/L (ref 3.5–5.1)
Sodium: 135 mmol/L (ref 135–145)

## 2022-12-28 LAB — CBC WITH DIFFERENTIAL/PLATELET
Abs Immature Granulocytes: 0.03 10*3/uL (ref 0.00–0.07)
Basophils Absolute: 0.1 10*3/uL (ref 0.0–0.1)
Basophils Relative: 1 %
Eosinophils Absolute: 0.2 10*3/uL (ref 0.0–0.5)
Eosinophils Relative: 2 %
HCT: 39.3 % (ref 36.0–46.0)
Hemoglobin: 13.2 g/dL (ref 12.0–15.0)
Immature Granulocytes: 0 %
Lymphocytes Relative: 15 %
Lymphs Abs: 1.2 10*3/uL (ref 0.7–4.0)
MCH: 31.1 pg (ref 26.0–34.0)
MCHC: 33.6 g/dL (ref 30.0–36.0)
MCV: 92.7 fL (ref 80.0–100.0)
Monocytes Absolute: 0.5 10*3/uL (ref 0.1–1.0)
Monocytes Relative: 6 %
Neutro Abs: 6.2 10*3/uL (ref 1.7–7.7)
Neutrophils Relative %: 76 %
Platelets: 276 10*3/uL (ref 150–400)
RBC: 4.24 MIL/uL (ref 3.87–5.11)
RDW: 12.7 % (ref 11.5–15.5)
WBC: 8.1 10*3/uL (ref 4.0–10.5)
nRBC: 0 % (ref 0.0–0.2)

## 2022-12-28 MED ORDER — SODIUM CHLORIDE 0.9 % IV BOLUS
500.0000 mL | Freq: Once | INTRAVENOUS | Status: AC
  Administered 2022-12-28: 500 mL via INTRAVENOUS

## 2022-12-28 MED ORDER — SODIUM CHLORIDE 0.9 % IV SOLN: INTRAVENOUS | Status: DC

## 2022-12-28 NOTE — ED Triage Notes (Signed)
Pt arrived via [facility] c/o new onset atrial fib. Per EMS, pt was originally 170s HR, then 150s. Also per EMS, pt naturally converted out of a fib en route. No prior Hx of atrial fib

## 2022-12-28 NOTE — ED Provider Notes (Signed)
[facility] Provider Note   CSN: [id number] Arrival date & time: 12/28/22  1142     History  Chief Complaint  Patient presents with   Atrial Fibrillation    Karen Huber is a 77 y.o. female.  Patient brought in by EMS they called for rapid heart rate.  Patient gets a terrible feeling when this occurs.  She has had it happen at least 2 times in the past.  At 1 point says that she did have some follow-up for it but it was not occurring frequently never had formal evaluation for it.  EMS stated her heart rate was 170s.  Then 150s.  Patient converted spontaneously and route.  Patient certainly with no formal diagnosis of atrial fibrillation but certainly sounds like she has had a history of rapid heartbeats in the past that gave her similar feelings as if she is going to pass out and kind of a feeling of doom.  Here Temp was 97.9 pulse 106 blood pressure 157/84 and oxygen sats were 94%.  Patient states she felt fine earlier today and felt fine yesterday.  Past medical history significant for hypertension high cholesterol.  Patient is never used tobacco products.  Patient states she mostly felt lightheaded.  There was no chest pain or shortness of breath.       Home Medications Prior to Admission medications   Medication Sig Start Date End Date Taking? Authorizing Provider  aspirin EC 81 MG tablet Take 81 mg by mouth every evening.    [provider]  calcium carbonate (OS-CAL) 600 MG TABS tablet Take 1,200 mg by mouth every evening.    [provider]  cetirizine (ZYRTEC) 10 MG tablet Take 10 mg by mouth every morning.    [provider]  Fish Oil-Cholecalciferol (FISH OIL + D3 PO) Take 1 capsule by mouth every evening.    [provider]  hydrochlorothiazide (MICROZIDE) 12.5 MG capsule Take by mouth daily.  12/27/14   [provider]  simvastatin (ZOCOR) 10 MG tablet Take 10 mg by mouth daily.   12/27/14   [provider]      Allergies    Patient has no known allergies.    Review of Systems   Review of Systems  Constitutional:  Negative for chills and fever.  HENT:  Negative for ear pain and sore throat.   Eyes:  Negative for pain and visual disturbance.  Respiratory:  Negative for cough and shortness of breath.   Cardiovascular:  Positive for palpitations. Negative for chest pain.  Gastrointestinal:  Negative for abdominal pain and vomiting.  Genitourinary:  Negative for dysuria and hematuria.  Musculoskeletal:  Negative for arthralgias and back pain.  Skin:  Negative for color change and rash.  Neurological:  Positive for light-headedness. Negative for seizures and syncope.  All other systems reviewed and are negative.   Physical Exam Updated Vital Signs BP (!) 150/83   Pulse 98   Temp 98 F (36.7 C) (Oral)   Resp 20   Ht 1.626 m (5' 4"$ )   Wt 59.2 kg   SpO2 98%   BMI 22.40 kg/m  Physical Exam Vitals and nursing note reviewed.  Constitutional:      General: She is not in acute distress.    Appearance: She is well-developed.  HENT:     Head: Normocephalic and atraumatic.     Mouth/Throat:     Mouth: Mucous membranes are moist.  Eyes:  Extraocular Movements: Extraocular movements intact.     Conjunctiva/sclera: Conjunctivae normal.     Pupils: Pupils are equal, round, and reactive to light.  Cardiovascular:     Rate and Rhythm: Regular rhythm. Tachycardia present.     Heart sounds: No murmur heard. Pulmonary:     Effort: Pulmonary effort is normal. No respiratory distress.     Breath sounds: No wheezing, rhonchi or rales.  Abdominal:     Palpations: Abdomen is soft.     Tenderness: There is no abdominal tenderness.  Musculoskeletal:        General: No swelling.     Cervical back: Neck supple.     Right lower leg: No edema.     Left lower leg: No edema.  Skin:    General: Skin is warm and dry.     Capillary Refill: Capillary refill takes  less than 2 seconds.  Neurological:     General: No focal deficit present.     Mental Status: She is alert and oriented to person, place, and time.  Psychiatric:        Mood and Affect: Mood normal.     ED Results / Procedures / Treatments   Labs (all labs ordered are listed, but only abnormal results are displayed) Labs Reviewed  BASIC METABOLIC PANEL - Abnormal; Notable for the following components:      Result Value   Glucose, Bld 120 (*)    All other components within normal limits  CBC WITH DIFFERENTIAL/PLATELET    EKG EKG Interpretation  Date/Time:  Friday December 28 2022 12:00:21 EST Ventricular Rate:  110 PR Interval:  176 QRS Duration: 82 QT Interval:  319 QTC Calculation: 432 R Axis:   67 Text Interpretation: Sinus tachycardia Consider left atrial enlargement Confirmed by Fredia Sorrow [phone]) on 12/28/2022 12:13:31 PM  Radiology DG Chest Port 1 View  Result Date: 12/28/2022 CLINICAL DATA:  Palpitations. EXAM: PORTABLE CHEST 1 VIEW COMPARISON:  Chest radiograph 01/21/2015 FINDINGS: Elevation of the right hemidiaphragm has increased from the previous examination. Heart size is within normal limits. Coarse lung markings appear chronic. No focal airspace disease or pulmonary edema. Trachea is midline. IMPRESSION: 1. Chronic lung changes without acute findings. 2. Increased elevation of the right hemidiaphragm. Electronically Signed   By: Markus Daft M.D.   On: 12/28/2022 13:01    Procedures Procedures    Medications Ordered in ED Medications  0.9 %  sodium chloride infusion (0 mLs Intravenous Stopped 12/28/22 1505)  sodium chloride 0.9 % bolus 500 mL (0 mLs Intravenous Stopped 12/28/22 1354)    ED Course/ Medical Decision Making/ A&P                             Medical Decision Making Amount and/or Complexity of Data Reviewed Labs: ordered. Radiology: ordered.  Risk Prescription drug management.   Patient remained completely asymptomatic here with heart  rate around 100.  Oxygen saturations remain good.  Symptoms completely resolved.  Sounds clinically as if it was consistent with an atrial fibrillation could have been SVT.  Strip from EMS somewhat suggestive of it being irregular.  CBC normal hemoglobin [id number] basic metabolic panel electrolytes normal blood sugar 120 renal function normal.  Chest x-ray negative for anything acute had some chronic lung changes.  Increased elevation of the right hemidiaphragm.  EKG here is sinus tachycardia.   Final Clinical Impression(s) / ED Diagnoses Final diagnoses:  History of atrial fibrillation  Palpitation    Rx / DC Orders ED Discharge Orders     None         Fredia Sorrow, MD 12/28/22 [phone]

## 2022-12-28 NOTE — Discharge Instructions (Signed)
Make an appointment to follow-up with cardiology.  Turn for development of rapid heart rate lasting 40 minutes or longer.  Return for any new or worse symptoms.

## 2023-01-03 ENCOUNTER — Encounter: Payer: Self-pay | Admitting: Internal Medicine

## 2023-01-03 ENCOUNTER — Ambulatory Visit: Payer: PPO | Attending: Internal Medicine | Admitting: Internal Medicine

## 2023-01-03 VITALS — BP 158/74 | HR 100 | Ht 63.5 in | Wt 137.6 lb

## 2023-01-03 DIAGNOSIS — Z8679 Personal history of other diseases of the circulatory system: Secondary | ICD-10-CM

## 2023-01-03 DIAGNOSIS — R002 Palpitations: Secondary | ICD-10-CM | POA: Diagnosis not present

## 2023-01-03 MED ORDER — METOPROLOL TARTRATE 25 MG PO TABS: 12.5000 mg | ORAL_TABLET | Freq: Two times a day (BID) | ORAL | 1 refills | Status: DC

## 2023-01-03 NOTE — Patient Instructions (Addendum)
Medication Instructions:  Your physician has recommended you make the following change in your medication:  Start metoprolol tartrate 12.5 mg twice daily Continue other medications the same  Labwork: TSH-today or tomorrow at [facility] Non-fasting  Testing/Procedures: none  Follow-Up: Your physician recommends that you schedule a follow-up appointment in: 1 year. You will receive a reminder call in the mail in about 10 months reminding you to call and schedule your appointment. If you don't receive this call, please contact our office.  Any Other Special Instructions Will Be Listed Below (If Applicable).  If you need a refill on your cardiac medications before your next appointment, please call your pharmacy.

## 2023-01-03 NOTE — Progress Notes (Signed)
Cardiology Office Note  Date: 01/03/2023   ID: [name], DOB 1946/03/08, MRN [id number]  PCP:  Karen Sites, MD  Cardiologist:  Chalmers Guest, MD Electrophysiologist:  None   Reason for Office Visit: Evaluation of palpitations at the request of Dr. Hilma Huber   History of Present Illness: Karen Huber is a 77 y.o. female known to have HTN, HLD was referred to cardiology clinic for evaluation of palpitations.  Patient was folding clothes in her bedroom on 12/28/2022 when she had sudden onset of palpitations associated with dizziness/blackout spells and was trying to sit/rest on the bed.  Her husband called 75, when EMS arrived and tried to make her sit in the chair, heart palpitations resolved completely and started to feel better. She had similar palpitations associated with blackout spells in the past but usually happens once in a while.  Unable to recall the frequency to me.  Otherwise denies any chest pain, SOB, leg swelling.  No smoking cigarettes.   Past Medical History:  Diagnosis Date   High cholesterol    Hypertension     Past Surgical History:  Procedure Laterality Date   MELANOMA EXCISION      Current Outpatient Medications  Medication Sig Dispense Refill   aspirin EC 81 MG tablet Take 81 mg by mouth every evening.     calcium carbonate (OS-CAL) 600 MG TABS tablet Take 1,200 mg by mouth every evening.     cetirizine (ZYRTEC) 10 MG tablet Take 10 mg by mouth every morning.     Fish Oil-Cholecalciferol (FISH OIL + D3 PO) Take 1 capsule by mouth every evening.     hydrochlorothiazide (MICROZIDE) 12.5 MG capsule Take by mouth daily.      simvastatin (ZOCOR) 10 MG tablet Take 10 mg by mouth daily.      No current facility-administered medications for this visit.   Allergies:  Patient has no known allergies.   Social History: The patient  reports that she has never smoked. She does not have any smokeless tobacco history on file. She reports that she  does not drink alcohol and does not use drugs.   Family History: The patient's family history includes Heart attack in her brother, father, and mother.   ROS:  Please see the history of present illness. Otherwise, complete review of systems is positive for none.  All other systems are reviewed and negative.   Physical Exam: VS:  BP (!) 158/74   Pulse 100   Ht 5' 3.5" (1.613 m)   Wt 137 lb 9.6 oz (62.4 kg)   SpO2 97%   BMI 23.99 kg/m , BMI Body mass index is 23.99 kg/m.  Wt Readings from Last 3 Encounters:  01/03/23 137 lb 9.6 oz (62.4 kg)  12/28/22 130 lb 8.2 oz (59.2 kg)  01/21/15 121 lb 9.6 oz (55.2 kg)    General: Patient appears comfortable at rest. HEENT: Conjunctiva and lids normal, oropharynx clear with moist mucosa. Neck: Supple, no elevated JVP or carotid bruits, no thyromegaly. Lungs: Clear to auscultation, nonlabored breathing at rest. Cardiac: Regular rate and rhythm, no S3 or significant systolic murmur, no pericardial rub. Abdomen: Soft, nontender, no hepatomegaly, bowel sounds present, no guarding or rebound. Extremities: No pitting edema, distal pulses 2+. Skin: Warm and dry. Musculoskeletal: No kyphosis. Neuropsychiatric: Alert and oriented x3, affect grossly appropriate.  ECG:  An ECG dated 01/03/2023 was personally reviewed today and demonstrated:  Sinus tachycardia  Recent Labwork: 12/28/2022: BUN 18; Creatinine, Ser 0.96;  Hemoglobin 13.2; Platelets 276; Potassium 3.5; Sodium 135  No results found for: "CHOL", "TRIG", "HDL", "CHOLHDL", "VLDL", "LDLCALC", "LDLDIRECT"  Other Studies Reviewed Today:   Assessment and Plan: Patient is a 77 year old F known to have HTN, HLD was referred to cardiology clinic for evaluation of palpitations at the request of Dr. Hilma Huber.  # Palpitations likely secondary to SVT -Patient had sudden onset and offset of palpitations associated with dizziness likely secondary to SVT. These SVT episodes are sporadic. Obtain TSH. Start  metoprolol tartrate 12.5 mg twice daily and try vagal maneuvers including deep exhalation. If she continues to have palpitations/SOB episodes on the current dose of metoprolol tartrate, she will need to double up the dose to 25 mg twice daily.  # HTN, controlled -Continue HCTZ 12.5 mg once daily.  Patient has not been checking her blood pressures recently instructed her to check her blood pressures every day. Goal blood pressure less than 140/90 mmHg.  She has upcoming appointment with her PCP next week.  # HLD -Continue simvastatin 10 mg nightly. Management of HLD per PCP.  I have spent a total of 45 minutes with patient reviewing chart, EKGs, labs and examining patient as well as establishing an assessment and plan that was discussed with the patient.  > 50% of time was spent in direct patient care.      Medication Adjustments/Labs and Tests Ordered: Current medicines are reviewed at length with the patient today.  Concerns regarding medicines are outlined above.   Tests Ordered: Orders Placed This Encounter  Procedures   EKG 12-Lead    Medication Changes: No orders of the defined types were placed in this encounter.   Disposition:  Follow up  1 year  Signed Kayly Kriegel Fidel Levy, MD, 01/03/2023 2:28 PM    [facility] [place], [place], [place] [address]

## 2023-01-04 DIAGNOSIS — Z8679 Personal history of other diseases of the circulatory system: Secondary | ICD-10-CM | POA: Diagnosis not present

## 2023-01-04 DIAGNOSIS — R002 Palpitations: Secondary | ICD-10-CM | POA: Diagnosis not present

## 2023-01-05 LAB — TSH: TSH: 0.849 u[IU]/mL (ref 0.450–4.500)

## 2023-01-10 DIAGNOSIS — Z8249 Family history of ischemic heart disease and other diseases of the circulatory system: Secondary | ICD-10-CM | POA: Diagnosis not present

## 2023-01-10 DIAGNOSIS — I471 Supraventricular tachycardia, unspecified: Secondary | ICD-10-CM | POA: Diagnosis not present

## 2023-01-10 DIAGNOSIS — Z6823 Body mass index (BMI) 23.0-23.9, adult: Secondary | ICD-10-CM | POA: Diagnosis not present

## 2023-01-10 DIAGNOSIS — I10 Essential (primary) hypertension: Secondary | ICD-10-CM | POA: Diagnosis not present

## 2023-01-10 DIAGNOSIS — E782 Mixed hyperlipidemia: Secondary | ICD-10-CM | POA: Diagnosis not present

## 2023-05-02 DIAGNOSIS — Z6823 Body mass index (BMI) 23.0-23.9, adult: Secondary | ICD-10-CM | POA: Diagnosis not present

## 2023-05-02 DIAGNOSIS — Z1331 Encounter for screening for depression: Secondary | ICD-10-CM | POA: Diagnosis not present

## 2023-05-02 DIAGNOSIS — E782 Mixed hyperlipidemia: Secondary | ICD-10-CM | POA: Diagnosis not present

## 2023-05-02 DIAGNOSIS — I10 Essential (primary) hypertension: Secondary | ICD-10-CM | POA: Diagnosis not present

## 2023-05-02 DIAGNOSIS — I471 Supraventricular tachycardia, unspecified: Secondary | ICD-10-CM | POA: Diagnosis not present

## 2023-05-02 DIAGNOSIS — Z0001 Encounter for general adult medical examination with abnormal findings: Secondary | ICD-10-CM | POA: Diagnosis not present

## 2023-05-02 DIAGNOSIS — Z8249 Family history of ischemic heart disease and other diseases of the circulatory system: Secondary | ICD-10-CM | POA: Diagnosis not present

## 2023-06-20 ENCOUNTER — Other Ambulatory Visit: Payer: Self-pay | Admitting: Internal Medicine

## 2023-09-16 ENCOUNTER — Other Ambulatory Visit: Payer: Self-pay | Admitting: Internal Medicine

## 2024-02-07 ENCOUNTER — Encounter: Payer: Self-pay | Admitting: Internal Medicine

## 2024-05-01 DIAGNOSIS — Z6823 Body mass index (BMI) 23.0-23.9, adult: Secondary | ICD-10-CM | POA: Diagnosis not present

## 2024-05-01 DIAGNOSIS — Z1331 Encounter for screening for depression: Secondary | ICD-10-CM | POA: Diagnosis not present

## 2024-05-01 DIAGNOSIS — I471 Supraventricular tachycardia, unspecified: Secondary | ICD-10-CM | POA: Diagnosis not present

## 2024-05-01 DIAGNOSIS — E782 Mixed hyperlipidemia: Secondary | ICD-10-CM | POA: Diagnosis not present

## 2024-05-01 DIAGNOSIS — I10 Essential (primary) hypertension: Secondary | ICD-10-CM | POA: Diagnosis not present

## 2024-05-01 DIAGNOSIS — Z0001 Encounter for general adult medical examination with abnormal findings: Secondary | ICD-10-CM | POA: Diagnosis not present

## 2024-05-04 ENCOUNTER — Other Ambulatory Visit (HOSPITAL_COMMUNITY): Payer: Self-pay | Admitting: Family Medicine

## 2024-05-04 DIAGNOSIS — Z1231 Encounter for screening mammogram for malignant neoplasm of breast: Secondary | ICD-10-CM

## 2024-05-05 ENCOUNTER — Encounter (HOSPITAL_COMMUNITY): Payer: Self-pay | Admitting: Family Medicine

## 2024-05-06 DIAGNOSIS — Z0001 Encounter for general adult medical examination with abnormal findings: Secondary | ICD-10-CM | POA: Diagnosis not present

## 2024-05-25 ENCOUNTER — Ambulatory Visit (HOSPITAL_COMMUNITY)
Admission: RE | Admit: 2024-05-25 | Discharge: 2024-05-25 | Disposition: A | Discharge status: Home / Self Care | Source: Ambulatory Visit | Attending: Family Medicine | Admitting: Family Medicine

## 2024-05-25 DIAGNOSIS — Z1231 Encounter for screening mammogram for malignant neoplasm of breast: Secondary | ICD-10-CM | POA: Insufficient documentation

## 2024-06-04 ENCOUNTER — Other Ambulatory Visit: Payer: Self-pay | Admitting: Internal Medicine

## 2024-06-26 DIAGNOSIS — Z6822 Body mass index (BMI) 22.0-22.9, adult: Secondary | ICD-10-CM | POA: Diagnosis not present

## 2024-06-26 DIAGNOSIS — I10 Essential (primary) hypertension: Secondary | ICD-10-CM | POA: Diagnosis not present

## 2024-06-26 DIAGNOSIS — Z8679 Personal history of other diseases of the circulatory system: Secondary | ICD-10-CM | POA: Diagnosis not present

## 2024-06-26 DIAGNOSIS — E785 Hyperlipidemia, unspecified: Secondary | ICD-10-CM | POA: Diagnosis not present

## 2024-06-26 DIAGNOSIS — Z85820 Personal history of malignant melanoma of skin: Secondary | ICD-10-CM | POA: Diagnosis not present

## 2024-07-01 ENCOUNTER — Telehealth: Payer: Self-pay | Admitting: Internal Medicine

## 2024-07-01 MED ORDER — METOPROLOL TARTRATE 25 MG PO TABS: 12.5000 mg | ORAL_TABLET | Freq: Two times a day (BID) | ORAL | 0 refills | Status: DC

## 2024-07-01 NOTE — Telephone Encounter (Signed)
 Rx sent to pharmacy

## 2024-07-01 NOTE — Telephone Encounter (Signed)
 1. Which medications need to be refilled? (please list name of each medication and dose if known)  metoprolol  tartrate   2. Which pharmacy/location (including street and city if local pharmacy) is medication to be sent to? [facility]  3. Do they need a 30 day or 90 day supply?  90

## 2024-07-09 ENCOUNTER — Other Ambulatory Visit (HOSPITAL_COMMUNITY): Payer: Self-pay | Admitting: Internal Medicine

## 2024-07-09 DIAGNOSIS — M810 Age-related osteoporosis without current pathological fracture: Secondary | ICD-10-CM

## 2024-07-13 ENCOUNTER — Ambulatory Visit (HOSPITAL_COMMUNITY)
Admission: RE | Admit: 2024-07-13 | Discharge: 2024-07-13 | Disposition: A | Discharge status: Home / Self Care | Source: Ambulatory Visit | Attending: Internal Medicine | Admitting: Internal Medicine

## 2024-07-13 DIAGNOSIS — M810 Age-related osteoporosis without current pathological fracture: Secondary | ICD-10-CM | POA: Diagnosis not present

## 2024-07-13 DIAGNOSIS — Z780 Asymptomatic menopausal state: Secondary | ICD-10-CM | POA: Diagnosis not present

## 2024-07-27 DIAGNOSIS — H524 Presbyopia: Secondary | ICD-10-CM | POA: Diagnosis not present

## 2024-07-27 DIAGNOSIS — H2513 Age-related nuclear cataract, bilateral: Secondary | ICD-10-CM | POA: Diagnosis not present

## 2024-08-10 ENCOUNTER — Other Ambulatory Visit: Payer: Self-pay | Admitting: Internal Medicine

## 2024-08-31 DIAGNOSIS — H6980 Other specified disorders of Eustachian tube, unspecified ear: Secondary | ICD-10-CM | POA: Diagnosis not present

## 2024-09-02 DIAGNOSIS — H6123 Impacted cerumen, bilateral: Secondary | ICD-10-CM | POA: Diagnosis not present

## 2024-09-02 DIAGNOSIS — H6121 Impacted cerumen, right ear: Secondary | ICD-10-CM | POA: Diagnosis not present

## 2024-09-02 DIAGNOSIS — H938X9 Other specified disorders of ear, unspecified ear: Secondary | ICD-10-CM | POA: Diagnosis not present

## 2024-09-02 DIAGNOSIS — R42 Dizziness and giddiness: Secondary | ICD-10-CM | POA: Diagnosis not present

## 2024-09-02 DIAGNOSIS — H6122 Impacted cerumen, left ear: Secondary | ICD-10-CM | POA: Diagnosis not present

## 2024-09-02 DIAGNOSIS — Z6821 Body mass index (BMI) 21.0-21.9, adult: Secondary | ICD-10-CM | POA: Diagnosis not present

## 2024-09-21 ENCOUNTER — Ambulatory Visit: Attending: Internal Medicine | Admitting: Internal Medicine

## 2024-09-21 ENCOUNTER — Encounter: Payer: Self-pay | Admitting: Internal Medicine

## 2024-09-21 VITALS — BP 148/96 | HR 72 | Ht 64.0 in | Wt 128.0 lb

## 2024-09-21 DIAGNOSIS — R002 Palpitations: Secondary | ICD-10-CM | POA: Diagnosis not present

## 2024-09-21 DIAGNOSIS — Z8679 Personal history of other diseases of the circulatory system: Secondary | ICD-10-CM | POA: Diagnosis not present

## 2024-09-21 DIAGNOSIS — I10 Essential (primary) hypertension: Secondary | ICD-10-CM

## 2024-09-21 MED ORDER — METOPROLOL TARTRATE 25 MG PO TABS: 12.5000 mg | ORAL_TABLET | Freq: Two times a day (BID) | ORAL | 3 refills | Status: AC

## 2024-09-21 NOTE — Patient Instructions (Signed)

## 2024-09-21 NOTE — Progress Notes (Signed)
 Cardiology Office Note  Date: 09/21/2024   ID: JOCILYNN GRADE, DOB 1946-03-17, MRN [id number]  PCP:  Marvine Rush, MD  Cardiologist:  Diannah SHAUNNA Maywood, MD Electrophysiologist:  None   Reason for Office Visit: Evaluation of palpitations at the request of Dr. Marvine   History of Present Illness: Karen Huber is a 78 y.o. female known to have HTN, HLD is here for follow-up visit of palpitations.  Feb 2024: Patient was folding clothes in her bedroom on 12/28/2022 when she had sudden onset of palpitations associated with dizziness/blackout spells and was trying to sit/rest on the bed.  Her husband called 911, when EMS arrived and tried to make her sit in the chair, heart palpitations resolved completely and started to feel better. She had similar palpitations associated with blackout spells in the past but usually happens once in a while.  Unable to recall the frequency to me.  Otherwise denies any chest pain, SOB, leg swelling.  No smoking cigarettes.   Patient is here today for follow-up visit.  After starting metoprolol  tartrate 12.5 mg twice daily, she did not have any more palpitations.  No angina or DOE.  No dizziness.  Past Medical History:  Diagnosis Date   High cholesterol    Hypertension     Past Surgical History:  Procedure Laterality Date   MELANOMA EXCISION      Current Outpatient Medications  Medication Sig Dispense Refill   aspirin  EC 81 MG tablet Take 81 mg by mouth every evening.     calcium carbonate (OS-CAL) 600 MG TABS tablet Take 1,200 mg by mouth every evening.     cetirizine (ZYRTEC) 10 MG tablet Take 10 mg by mouth every morning.     Fish Oil-Cholecalciferol (FISH OIL + D3 PO) Take 1 capsule by mouth every evening.     hydrochlorothiazide (MICROZIDE) 12.5 MG capsule Take by mouth daily.      metoprolol  tartrate (LOPRESSOR ) 25 MG tablet Take 1/2 (one-half) tablet by mouth twice daily 60 tablet 0   simvastatin (ZOCOR) 10 MG tablet Take 10 mg by  mouth daily.      No current facility-administered medications for this visit.   Allergies:  Patient has no known allergies.   Social History: The patient  reports that she has never smoked. She does not have any smokeless tobacco history on file. She reports that she does not drink alcohol and does not use drugs.   Family History: The patient's family history includes Heart attack in her brother, father, and mother.   ROS:  Please see the history of present illness. Otherwise, complete review of systems is positive for none.  All other systems are reviewed and negative.   Physical Exam: VS:  BP (!) 148/96   Pulse 72   Ht 5' 4 (1.626 m)   Wt 128 lb (58.1 kg)   SpO2 97%   BMI 21.97 kg/m , BMI Body mass index is 21.97 kg/m.  Wt Readings from Last 3 Encounters:  09/21/24 128 lb (58.1 kg)  01/03/23 137 lb 9.6 oz (62.4 kg)  12/28/22 130 lb 8.2 oz (59.2 kg)    General: Patient appears comfortable at rest. HEENT: Conjunctiva and lids normal, oropharynx clear with moist mucosa. Neck: Supple, no elevated JVP or carotid bruits, no thyromegaly. Lungs: Clear to auscultation, nonlabored breathing at rest. Cardiac: Regular rate and rhythm, no S3 or significant systolic murmur, no pericardial rub. Abdomen: Soft, nontender, no hepatomegaly, bowel sounds present, no guarding or rebound.  Extremities: No pitting edema, distal pulses 2+. Skin: Warm and dry. Musculoskeletal: No kyphosis. Neuropsychiatric: Alert and oriented x3, affect grossly appropriate.  ECG:  An ECG dated 01/03/2023 was personally reviewed today and demonstrated:  Sinus tachycardia  Recent Labwork: No results found for requested labs within last 365 days.  No results found for: CHOL, TRIG, HDL, CHOLHDL, VLDL, LDLCALC, LDLDIRECT  Other Studies Reviewed Today:   Assessment and Plan:  # Palpitations likely secondary to SVT - Patient had sudden onset and offset of palpitations associated with dizziness  likely secondary to SVT. These SVT episodes are sporadic.  After starting metoprolol  tartrate 12.5 mg twice daily, palpitations completely resolved.  Continue metoprolol  tartrate 12.5 mg twice daily.  Refilled.  If she has any recurrent palpitations in the future, she can return back to cardiology clinic for further evaluation.  # HTN, controlled with an element of whitecoat HTN - Continue HCTZ 12.5 mg once daily.  Home pressures range around 130 mmHg SBP.  # HLD -Continue simvastatin 10 mg nightly. Management of HLD per PCP.  I have spent a total of 30 minutes with patient reviewing chart, EKGs, labs and examining patient as well as establishing an assessment and plan that was discussed with the patient.  > 50% of time was spent in direct patient care.      Medication Adjustments/Labs and Tests Ordered: Current medicines are reviewed at length with the patient today.  Concerns regarding medicines are outlined above.   Tests Ordered: Orders Placed This Encounter  Procedures   EKG 12-Lead    Medication Changes: No orders of the defined types were placed in this encounter.   Disposition:  Follow up PRN  Signed Taylen Osorto Arleta Maywood, MD, 09/21/2024 11:00 AM    [facility] at [facility] [address] [place], [place], [place] [address]

## 2024-11-25 NOTE — Progress Notes (Signed)
 Karen Huber                                          MRN: [id number]   11/25/2024   The [facility] Quality Team Specialist reviewed this patient medical record for the purposes of chart review for care gap closure. The following were reviewed: chart review for care gap closure-controlling blood pressure.    [facility] Quality Team
